# Patient Record
Sex: Male | Born: 1980 | Race: White | Hispanic: No | Marital: Married | Smoking: Never smoker
Health system: Southern US, Community
[De-identification: ages and names within clinical notes are randomized; demographics above are authoritative.]

## PROBLEM LIST (undated history)

## (undated) HISTORY — PX: SEPTOPLASTY: SUR1290

---

## 2010-03-27 ENCOUNTER — Encounter: Admission: RE | Admit: 2010-03-27 | Discharge: 2010-03-27 | Payer: Self-pay | Admitting: Otolaryngology

## 2015-02-08 ENCOUNTER — Encounter (HOSPITAL_COMMUNITY): Payer: Self-pay | Admitting: Emergency Medicine

## 2015-02-08 ENCOUNTER — Emergency Department (HOSPITAL_COMMUNITY)
Admission: EM | Admit: 2015-02-08 | Discharge: 2015-02-09 | Disposition: A | Discharge status: Home / Self Care | Payer: Federal, State, Local not specified - PPO | Attending: Emergency Medicine | Admitting: Emergency Medicine

## 2015-02-08 DIAGNOSIS — R509 Fever, unspecified: Secondary | ICD-10-CM | POA: Insufficient documentation

## 2015-02-08 LAB — COMPREHENSIVE METABOLIC PANEL
ALT: 15 U/L — AB (ref 17–63)
AST: 22 U/L (ref 15–41)
Albumin: 4 g/dL (ref 3.5–5.0)
Alkaline Phosphatase: 50 U/L (ref 38–126)
Anion gap: 9 (ref 5–15)
BUN: 13 mg/dL (ref 6–20)
CO2: 28 mmol/L (ref 22–32)
CREATININE: 1.08 mg/dL (ref 0.61–1.24)
Calcium: 9.3 mg/dL (ref 8.9–10.3)
Chloride: 101 mmol/L (ref 101–111)
GFR calc non Af Amer: >60 mL/min (ref >60)
Glucose, Bld: 133 mg/dL — ABNORMAL HIGH (ref 65–99)
Potassium: 3.7 mmol/L (ref 3.5–5.1)
Sodium: 138 mmol/L (ref 135–145)
Total Bilirubin: 1 mg/dL (ref 0.3–1.2)
Total Protein: 6.8 g/dL (ref 6.5–8.1)

## 2015-02-08 LAB — CBC WITH DIFFERENTIAL/PLATELET
BASOS ABS: 0 10*3/uL (ref 0.0–0.1)
Basophils Relative: 0 % (ref 0–1)
EOS ABS: 0.1 10*3/uL (ref 0.0–0.7)
EOS PCT: 1 % (ref 0–5)
HCT: 42.9 % (ref 39.0–52.0)
Hemoglobin: 14.8 g/dL (ref 13.0–17.0)
LYMPHS ABS: 0.5 10*3/uL — AB (ref 0.7–4.0)
Lymphocytes Relative: 9 % — ABNORMAL LOW (ref 12–46)
MCH: 30.5 pg (ref 26.0–34.0)
MCHC: 34.5 g/dL (ref 30.0–36.0)
MCV: 88.3 fL (ref 78.0–100.0)
MONO ABS: 0.1 10*3/uL (ref 0.1–1.0)
Monocytes Relative: 2 % — ABNORMAL LOW (ref 3–12)
Neutro Abs: 4.8 10*3/uL (ref 1.7–7.7)
Neutrophils Relative %: 88 % — ABNORMAL HIGH (ref 43–77)
Platelets: 122 10*3/uL — ABNORMAL LOW (ref 150–400)
RBC: 4.86 MIL/uL (ref 4.22–5.81)
RDW: 12.9 % (ref 11.5–15.5)
WBC: 5.4 10*3/uL (ref 4.0–10.5)

## 2015-02-08 MED ORDER — ACETAMINOPHEN 500 MG PO TABS
1000.0000 mg | ORAL_TABLET | Freq: Once | ORAL | Status: AC
  Administered 2015-02-09: 1000 mg via ORAL
  Filled 2015-02-08: qty 2

## 2015-02-08 MED ORDER — SODIUM CHLORIDE 0.9 % IV BOLUS (SEPSIS)
1000.0000 mL | INTRAVENOUS | Status: AC
  Administered 2015-02-09: 1000 mL via INTRAVENOUS

## 2015-02-08 MED ORDER — ACETAMINOPHEN 325 MG PO TABS
650.0000 mg | ORAL_TABLET | Freq: Once | ORAL | Status: DC
Start: 2015-02-08 — End: 2015-02-09

## 2015-02-08 MED ORDER — SODIUM CHLORIDE 0.9 % IV BOLUS (SEPSIS)
500.0000 mL | INTRAVENOUS | Status: AC
  Administered 2015-02-09: 500 mL via INTRAVENOUS

## 2015-02-08 NOTE — ED Notes (Signed)
[facility] contacted to call Code Sepsis 

## 2015-02-08 NOTE — ED Notes (Signed)
Pt. reports fever , chills, body aches and 'colds" onset this week . Pt. took Ibuprofen prior to arrival with slight relief. Denies cough or congestion .

## 2015-02-08 NOTE — ED Provider Notes (Signed)
CSN: [id number]     Arrival date & time 02/08/15  2221 History  This chart was scribed for Tomasita Crumble, MD by Freida Busman, ED Scribe. This patient was seen in room D34C/D34C and the patient's care was started 11:31 PM.    Chief Complaint  Patient presents with  . Fever  . Chills  . Generalized Body Aches     The history is provided by the patient. No language interpreter was used.     HPI Comments:  Bradley Reese is a 34 y.o. male who presents to the Emergency Department complaining of a fever that started today with a max temp of 102.9. Pt states he started shaking after dinner tonight; states he was coherent during this episode. He denies diaphoresis at the time of this episode but has since become diaphoretic. Pt reports similar episode ~2 weeks ago after spending a large amount of time outside; his friend told him he may have had a heat stroke. Pt has taken Motrin twice today; his last dose was  ~2130. He reports associated sore throat for 2 days, mild intermittent HA, and mild generalized body aches. He denies urinary symptoms, vomiting, diarrhea, neck pain, urinary symptoms, bowel/bladder incontincence.     History reviewed. No pertinent past medical history. History reviewed. No pertinent past surgical history. No family history on file. Social History  Substance Use Topics  . Smoking status: Never Smoker   . Smokeless tobacco: None  . Alcohol Use: Yes    Review of Systems  A complete 10 system review of systems was obtained and all systems are negative except as noted in the HPI and PMH.    Allergies  Review of patient's allergies indicates no known allergies.  Home Medications   Prior to Admission medications   Not on File   BP 141/79 mmHg  Pulse 124  Temp(Src) 100 F (37.8 C) (Oral)  Resp 18  Ht  (1.778 m)  Wt 167 lb (75.751 kg)  BMI 23.96 kg/m2  SpO2 95% Physical Exam  Constitutional: He is oriented to person, place, and time. Vital signs are  normal. He appears well-developed and well-nourished.  Non-toxic appearance. He does not appear ill. No distress.  Tactile fever  HENT:  Head: Normocephalic and atraumatic.  Nose: Nose normal.  Mouth/Throat: Oropharynx is clear and moist. No oropharyngeal exudate.  Eyes: Conjunctivae and EOM are normal. Pupils are equal, round, and reactive to light. No scleral icterus.  Neck: Normal range of motion. Neck supple. No tracheal deviation, no edema, no erythema and normal range of motion present. No thyroid mass and no thyromegaly present.  Cardiovascular: Normal rate, regular rhythm, S1 normal, S2 normal, normal heart sounds, intact distal pulses and normal pulses.  Exam reveals no gallop and no friction rub.   No murmur heard. Pulses:      Radial pulses are 2+ on the right side, and 2+ on the left side.       Dorsalis pedis pulses are 2+ on the right side, and 2+ on the left side.  Pulmonary/Chest: Effort normal and breath sounds normal. No respiratory distress. He has no wheezes. He has no rhonchi. He has no rales.  Abdominal: Soft. Normal appearance and bowel sounds are normal. He exhibits no distension, no ascites and no mass. There is no hepatosplenomegaly. There is no tenderness. There is no rebound, no guarding and no CVA tenderness.  Musculoskeletal: Normal range of motion. He exhibits no edema or tenderness.  Lymphadenopathy:  He has no cervical adenopathy.  Neurological: He is alert and oriented to person, place, and time. He has normal strength. No cranial nerve deficit or sensory deficit.  nml strength and sensation to all extremities nml cerebellar exam  Skin: Skin is warm and intact. No petechiae and no rash noted. He is diaphoretic. No erythema. No pallor.  Psychiatric: He has a normal mood and affect. His behavior is normal. Judgment normal.  Nursing note and vitals reviewed.   ED Course  Procedures   DIAGNOSTIC STUDIES:  Oxygen Saturation is 95% on RA, adequate by my  interpretation.    COORDINATION OF CARE:  11:35 PM Discussed treatment plan with pt at bedside and pt agreed to plan.  Labs Review Labs Reviewed  CBC WITH DIFFERENTIAL/PLATELET - Abnormal; Notable for the following:    Platelets 122 (*)    Neutrophils Relative % 88 (*)    Lymphocytes Relative 9 (*)    Lymphs Abs 0.5 (*)    Monocytes Relative 2 (*)    All other components within normal limits  COMPREHENSIVE METABOLIC PANEL - Abnormal; Notable for the following:    Glucose, Bld 133 (*)    ALT 15 (*)    All other components within normal limits  URINALYSIS, ROUTINE W REFLEX MICROSCOPIC (NOT AT [facility]) - Abnormal; Notable for the following:    Ketones, ur 15 (*)    All other components within normal limits  CULTURE, BLOOD (ROUTINE X 2)  CULTURE, BLOOD (ROUTINE X 2)  URINE CULTURE  I-STAT CG4 LACTIC ACID, ED  Rosezena Sensor, ED    Imaging Review Dg Chest 2 View  02/09/2015   CLINICAL DATA:  Fever of unknown origin. Right nurse. Seven control shaking for couple of hr. Second time in 3 weeks.  EXAM: CHEST  2 VIEW  COMPARISON:  None.  FINDINGS: The heart size and mediastinal contours are within normal limits. Both lungs are clear. The visualized skeletal structures are unremarkable.  IMPRESSION: No active cardiopulmonary disease.   Electronically Signed   By: Burman Nieves M.D.   On: 02/09/2015 01:46   I have personally reviewed and evaluated these images and lab results as part of my medical decision-making.   EKG Interpretation   Date/Time:  Sunday February 09 2015 00:07:10 EDT Ventricular Rate:  79 PR Interval:  145 QRS Duration: 97 QT Interval:  397 QTC Calculation: 455 R Axis:   72 Text Interpretation:  Sinus rhythm Probable left atrial enlargement  Baseline wander in lead(s) V3 V5 V6 Confirmed by Erroll Luna  [phone]) on 02/09/2015 12:15:15 AM      MDM   Final diagnoses:  None  Patient presents to the ED for fevers, chills, and weakness.  He also has  concern for a heart condition.  He took motrin, but still has a fever here to 38.5.  He was given tylenol in the ED.  Will perform sepsis work up as well.    CXR and UA negative for infectious cause.  Patients fever and tachycardia has resolved and he feels back to baseline.  He was advised to see a PCP within 3 days for close follow up.  His VS remain within his normal limits and he is safe for discharge.  I personally performed the services described in this documentation, which was scribed in my presence. The recorded information has been reviewed and is accurate.   Tomasita Crumble, MD 02/09/15 0200

## 2015-02-08 NOTE — ED Notes (Signed)
MD at bedside. 

## 2015-02-09 ENCOUNTER — Emergency Department (HOSPITAL_COMMUNITY): Payer: Federal, State, Local not specified - PPO

## 2015-02-09 LAB — I-STAT TROPONIN, ED: Troponin i, poc: 0 ng/mL (ref 0.00–0.08)

## 2015-02-09 LAB — URINALYSIS, ROUTINE W REFLEX MICROSCOPIC
Bilirubin Urine: NEGATIVE
GLUCOSE, UA: NEGATIVE mg/dL
Hgb urine dipstick: NEGATIVE
Ketones, ur: 15 mg/dL — AB
LEUKOCYTES UA: NEGATIVE
NITRITE: NEGATIVE
PROTEIN: NEGATIVE mg/dL
Specific Gravity, Urine: 1.011 (ref 1.005–1.030)
Urobilinogen, UA: 0.2 mg/dL (ref 0.0–1.0)
pH: 6.5 (ref 5.0–8.0)

## 2015-02-09 LAB — I-STAT CG4 LACTIC ACID, ED: Lactic Acid, Venous: 0.59 mmol/L (ref 0.5–2.0)

## 2015-02-09 NOTE — Discharge Instructions (Signed)
Fever, Adult Bradley Reese, continue to use ibuprofen or tylenol for your fever.  See a primary care doctor within 3 days for close follow up.  If fever persists or if you develop other symptoms, return to the ED immediately. Thank you. A fever is a temperature of 100.4 F (38 C) or above.  HOME CARE  Take fever medicine as told by your doctor. Do not  take aspirin for fever if you are younger than 34 years of age.  If you are given antibiotic medicine, take it as told. Finish the medicine even if you start to feel better.  Rest.  Drink enough fluids to keep your pee (urine) clear or pale yellow. Do not drink alcohol.  Take a bath or shower with room temperature water. Do not use ice water or alcohol sponge baths.  Wear lightweight, loose clothes. GET HELP RIGHT AWAY IF:   You are short of breath or have trouble breathing.  You are very weak.  You are dizzy or you pass out (faint).  You are very thirsty or are making little or no urine.  You have new pain.  You throw up (vomit) or have watery poop (diarrhea).  You keep throwing up or having watery poop for more than 1 to 2 days.  You have a stiff neck or light bothers your eyes.  You have a skin rash.  You have a fever or problems (symptoms) that last for more than 2 to 3 days.  You have a fever and your problems quickly get worse.  You keep throwing up the fluids you drink.  You do not feel better after 3 days.  You have new problems. MAKE SURE YOU:   Understand these instructions.  Will watch your condition.  Will get help right away if you are not doing well or get worse. Document Released: 03/09/2008 Document Revised: 08/23/2011 Document Reviewed: 04/01/2011 [facility] Patient Information 2015 [employer], [place]. This information is not intended to replace advice given to you by your health care provider. Make sure you discuss any questions you have with your health care provider.

## 2015-02-09 NOTE — ED Notes (Signed)
Pt obtaining urine sample at this time.

## 2015-02-10 LAB — URINE CULTURE: Culture: NO GROWTH

## 2015-02-14 LAB — CULTURE, BLOOD (ROUTINE X 2): Culture: NO GROWTH

## 2015-06-10 ENCOUNTER — Telehealth: Payer: Self-pay | Admitting: Internal Medicine

## 2015-06-10 NOTE — Telephone Encounter (Signed)
Received records from [facility] for appointment on 07/14/14 with Dr Rennis GoldenHilty.  Records given to [employer] (medical records) for Dr [facility] on 07/14/14. lp

## 2015-07-15 ENCOUNTER — Ambulatory Visit (INDEPENDENT_AMBULATORY_CARE_PROVIDER_SITE_OTHER): Payer: Federal, State, Local not specified - PPO | Admitting: Internal Medicine

## 2015-07-15 ENCOUNTER — Encounter: Payer: Self-pay | Admitting: Internal Medicine

## 2015-07-15 VITALS — BP 110/62 | HR 59 | Ht 70.0 in | Wt 163.4 lb

## 2015-07-15 DIAGNOSIS — R0602 Shortness of breath: Secondary | ICD-10-CM | POA: Diagnosis not present

## 2015-07-15 DIAGNOSIS — R0789 Other chest pain: Secondary | ICD-10-CM | POA: Diagnosis not present

## 2015-07-15 DIAGNOSIS — R5383 Other fatigue: Secondary | ICD-10-CM | POA: Diagnosis not present

## 2015-07-15 DIAGNOSIS — Z13220 Encounter for screening for lipoid disorders: Secondary | ICD-10-CM | POA: Diagnosis not present

## 2015-07-15 NOTE — Patient Instructions (Signed)
Dr Rennis Golden has requested that you have an echocardiogram. Echocardiography is a painless test that uses sound waves to create images of your heart. It provides your doctor with information about the size and shape of your heart and how well your heart's chambers and valves are working. This procedure takes approximately one hour. There are no restrictions for this procedure. This will be done at our [facility] location - [address].  Your physician has recommended that you have a cardiopulmonary stress test (CPX). CPX testing is a non-invasive measurement of heart and lung function. It replaces a traditional treadmill stress test. This type of test provides a tremendous amount of information that relates not only to your present condition but also for future outcomes. This test combines measurements of you ventilation, respiratory gas exchange in the lungs, electrocardiogram (EKG), blood pressure and physical response before, during, and following an exercise protocol.  Your physician recommends that you return for lab work at your earliest convenience - FASTING.  Dr Rennis Golden recommends that you schedule a follow-up appointment after your tests have been completed.

## 2015-07-15 NOTE — Progress Notes (Signed)
OFFICE NOTE  Chief Complaint:  Shortness of breath, fatigue, chest discomfort  Primary Care Physician: Sissy Hoff, MD  HPI:  Bradley Reese is a pleasant 35 year old male who is referred to me by Bradley Reese for evaluation of shortness of breath and chest discomfort. His mother is actually a patient of mine as well. His family history is significant more on his father's side who had had 2 vessel bypass surgery and a number of other cardiac issues. Mr. Bradley Reese reports that he has been under a lot of stress recently as his daughter was initially thought to have a VSD but later found to have PAPVR. She underwent surgery at [facility] in his possibly in need of another surgery. Recently he's noted that during weight lifting he has some discomfort in his chest. He gets short of breath and oftentimes causes him to discontinue exercise. He always feels a little bit worse after exercising rather than better. In general though he feels like he is pretty healthy. He plays tennis and is fairly active and does not have any problems doing that type of exercise. He had an unusual episode this past summer where he went to the emergency department and had fever up to 105 with rigors, but there were no clear causes for this episode. He says that the symptoms actually been going on for some time and not necessarily worsening or no crescendo pattern. He denies any typical chest pain. He just feels a strange sensation. Apparently had a cholesterol profile 2014 which showed an LDL 100.  PMHx:  No past medical history on file.  Past Surgical History  Procedure Laterality Date  . Septoplasty      FAMHx:  Family History  Problem Relation Age of Onset  . Transient ischemic attack Mother   . Hypertension Mother   . COPD Mother   . Cancer Father     stage 4 cancer  . Heart disease Father     double bypass  . Cancer Maternal Grandmother   . Heart disease Paternal Grandmother   . Congenital heart  disease Child     vsd, afo, papvr    SOCHx:   reports that he has never smoked. He does not have any smokeless tobacco history on file. He reports that he drinks alcohol. He reports that he does not use illicit drugs.  ALLERGIES:  No Known Allergies  ROS: Pertinent items noted in HPI and remainder of comprehensive ROS otherwise negative.  HOME MEDS: Current Outpatient Prescriptions  Medication Sig Dispense Refill  . Nutritional Supplements (JUICE PLUS FIBRE PO) Take by mouth daily.    . Triamcinolone Acetonide (NASACORT AQ NA) Place 1 spray into the nose daily.     No current facility-administered medications for this visit.    LABS/IMAGING: No results found for this or any previous visit (from the past 48 hour(s)). No results found.  WEIGHTS: Wt Readings from Last 3 Encounters:  07/15/15 163 lb 6.4 oz (74.118 kg)  02/09/15 167 lb (75.751 kg)    VITALS: BP 110/62 mmHg  Pulse 59  Ht  (1.778 m)  Wt 163 lb 6.4 oz (74.118 kg)  BMI 23.45 kg/m2  EXAM: General appearance: alert and no distress Neck: no carotid bruit, no JVD and thyroid not enlarged, symmetric, no tenderness/mass/nodules Lungs: clear to auscultation bilaterally Heart: regular rate and rhythm, S1, S2 normal, no murmur, click, rub or gallop Abdomen: soft, non-tender; bowel sounds normal; no masses,  no organomegaly Extremities: extremities normal,  atraumatic, no cyanosis or edema Pulses: 2+ and symmetric Skin: Skin color, texture, turgor normal. No rashes or lesions Neurologic: Grossly normal Psych: Pleasant  EKG: Sinus bradycardia 59  ASSESSMENT: 1. Chest discomfort and shortness of breath with weight lifting  PLAN: 1.   [name] has few personal risk factors for coronary disease but does have a strong family history of heart disease. He is reported fairly stable discomfort after lifting which include some shortness of breath and discomfort in the chest. He says that sometimes it causes him to  discontinue exercise. He feels chronically a little fatigue and wears out fairly easily. His EKG is reassuring. I would like an echocardiogram to rule out any possible cardiomyopathy. In addition, I think would be beneficial to get a cardio metabolic exercise test. This or be helpful to determine his VO2 with exercise. If he has some type of metabolic deficiency that is causing him to be fatigued or short of breath easily that would be more identified on this type of testing versus traditional treadmill stress testing. I'll also check a fasting lipid profile. Plan to see him back to review those results in a few weeks.  Thanks again for the kind referral.  Chrystie Nose, MD, [facility] Attending Cardiologist [facility]  Chrystie Nose 07/15/2015, 9:50 AM

## 2015-07-22 ENCOUNTER — Ambulatory Visit (HOSPITAL_COMMUNITY): Payer: Federal, State, Local not specified - PPO | Attending: Family Medicine

## 2015-07-22 DIAGNOSIS — R0602 Shortness of breath: Secondary | ICD-10-CM | POA: Diagnosis not present

## 2015-07-23 ENCOUNTER — Other Ambulatory Visit (HOSPITAL_COMMUNITY): Payer: Self-pay | Admitting: *Deleted

## 2015-07-23 DIAGNOSIS — R0602 Shortness of breath: Secondary | ICD-10-CM | POA: Diagnosis not present

## 2015-07-24 LAB — LIPID PANEL
CHOLESTEROL: 171 mg/dL (ref 125–200)
HDL: 46 mg/dL (ref >=40)
LDL CALC: 109 mg/dL (ref <130)
TRIGLYCERIDES: 82 mg/dL (ref <150)
Total CHOL/HDL Ratio: 3.7 Ratio (ref <=5.0)
VLDL: 16 mg/dL (ref <30)

## 2015-08-05 ENCOUNTER — Other Ambulatory Visit: Payer: Self-pay

## 2015-08-05 ENCOUNTER — Ambulatory Visit (HOSPITAL_COMMUNITY): Payer: Federal, State, Local not specified - PPO | Attending: Cardiology

## 2015-08-05 DIAGNOSIS — R0602 Shortness of breath: Secondary | ICD-10-CM | POA: Insufficient documentation

## 2015-08-05 DIAGNOSIS — I517 Cardiomegaly: Secondary | ICD-10-CM | POA: Insufficient documentation

## 2015-08-05 DIAGNOSIS — I340 Nonrheumatic mitral (valve) insufficiency: Secondary | ICD-10-CM | POA: Diagnosis not present

## 2015-08-05 DIAGNOSIS — I071 Rheumatic tricuspid insufficiency: Secondary | ICD-10-CM | POA: Diagnosis not present

## 2015-08-05 DIAGNOSIS — Z8249 Family history of ischemic heart disease and other diseases of the circulatory system: Secondary | ICD-10-CM | POA: Insufficient documentation

## 2015-08-05 DIAGNOSIS — R0600 Dyspnea, unspecified: Secondary | ICD-10-CM | POA: Diagnosis present

## 2015-08-13 ENCOUNTER — Encounter: Payer: Self-pay | Admitting: Internal Medicine

## 2015-08-13 ENCOUNTER — Ambulatory Visit (INDEPENDENT_AMBULATORY_CARE_PROVIDER_SITE_OTHER): Payer: Federal, State, Local not specified - PPO | Admitting: Internal Medicine

## 2015-08-13 ENCOUNTER — Other Ambulatory Visit: Payer: Self-pay | Admitting: *Deleted

## 2015-08-13 VITALS — BP 122/80 | HR 60 | Ht 70.0 in | Wt 159.0 lb

## 2015-08-13 DIAGNOSIS — D689 Coagulation defect, unspecified: Secondary | ICD-10-CM

## 2015-08-13 DIAGNOSIS — I429 Cardiomyopathy, unspecified: Secondary | ICD-10-CM

## 2015-08-13 DIAGNOSIS — R5383 Other fatigue: Secondary | ICD-10-CM

## 2015-08-13 DIAGNOSIS — Z01818 Encounter for other preprocedural examination: Secondary | ICD-10-CM

## 2015-08-13 DIAGNOSIS — R9439 Abnormal result of other cardiovascular function study: Secondary | ICD-10-CM

## 2015-08-13 DIAGNOSIS — Z79899 Other long term (current) drug therapy: Secondary | ICD-10-CM | POA: Diagnosis not present

## 2015-08-13 DIAGNOSIS — I420 Dilated cardiomyopathy: Secondary | ICD-10-CM

## 2015-08-13 NOTE — Progress Notes (Signed)
OFFICE NOTE  Chief Complaint:  Follow-up studies  Primary Care Physician: Sissy Hoff, MD  HPI:  Bradley Reese is a pleasant 35 year old male who is referred to me by [name] for evaluation of shortness of breath and chest discomfort. His mother is actually a patient of mine as well. His family history is significant more on his father's side who had had 2 vessel bypass surgery and a number of other cardiac issues. Bradley Reese reports that he has been under a lot of stress recently as his daughter was initially thought to have a VSD but later found to have PAPVR. She underwent surgery at [facility] in his possibly in need of another surgery. Recently he's noted that during weight lifting he has some discomfort in his chest. He gets short of breath and oftentimes causes him to discontinue exercise. He always feels a little bit worse after exercising rather than better. In general though he feels like he is pretty healthy. He plays tennis and is fairly active and does not have any problems doing that type of exercise. He had an unusual episode this past summer where he went to the emergency department and had fever up to 105 with rigors, but there were no clear causes for this episode. He says that the symptoms actually been going on for some time and not necessarily worsening or no crescendo pattern. He denies any typical chest pain. He just feels a strange sensation. Apparently had a cholesterol profile 2014 which showed an LDL 100.  Bradley Reese returns today for follow-up of his studies. He underwent cutting pulmonary exercise testing which showed good exercise tolerance however he had a significant drop in oxygen saturation near and exercise. His O2 saturation dropped to 73% without a clear etiology. There was concern about possible shunting. Subsequently he had an echocardiogram which was already ordered. As I did not know about shunting, the study was not done with bubble contrast.  This demonstrated, unfortunately that he has moderate global hypokinesis with an EF of 45-50% and mild dilation of left ventricle. He has reported chest pain with exertion, but more like shortness of breath and heaviness. He has not done much exercise since his stress testing.  PMHx:  No past medical history on file.  Past Surgical History  Procedure Laterality Date  . Septoplasty      FAMHx:  Family History  Problem Relation Age of Onset  . Transient ischemic attack Mother   . Hypertension Mother   . COPD Mother   . Cancer Father     stage 4 cancer  . Heart disease Father     double bypass  . Cancer Maternal Grandmother   . Heart disease Paternal Grandmother   . Congenital heart disease Child     vsd, afo, papvr    SOCHx:   reports that he has never smoked. He has never used smokeless tobacco. He reports that he drinks alcohol. He reports that he does not use illicit drugs.  ALLERGIES:  No Known Allergies  ROS: Pertinent items noted in HPI and remainder of comprehensive ROS otherwise negative.  HOME MEDS: Current Outpatient Prescriptions  Medication Sig Dispense Refill  . Nutritional Supplements (JUICE PLUS FIBRE PO) Take 1 tablet by mouth daily.     . Triamcinolone Acetonide (NASACORT AQ NA) Place 1 spray into the nose daily as needed.      No current facility-administered medications for this visit.    LABS/IMAGING: No results found for this  or any previous visit (from the past 48 hour(s)). No results found.  WEIGHTS: Wt Readings from Last 3 Encounters:  08/13/15 159 lb (72.122 kg)  07/15/15 163 lb 6.4 oz (74.118 kg)  02/09/15 167 lb (75.751 kg)    VITALS: BP 122/80 mmHg  Pulse 60  Ht  (1.778 m)  Wt 159 lb (72.122 kg)  BMI 22.81 kg/m2  SpO2 97%  EXAM: deferred  EKG: deferred  ASSESSMENT: 1. Chest discomfort and shortness of breath with weight lifting 2. 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 Exercise-induced hypoxemia, question shunting  PLAN: 1.   Bradley Reese has exercise induced hypoxemia with a newly diagnosed cardiomyopathy of EF 45-50%. Given his daughter's congenital abnormalities, I'm suspicious that he may have some congenital heart disease. Possibilities include PFO or atrial septal defect or abnormal pulmonary venous return, as is the case with his daughter who had PAPVR. I would recommend Obtaining a TEE with bubble study to look for atrial level shunting or extrapulmonary shunting. If there is evidence of extrapulmonary shunting, then I recommend a cardiac CT angiogram, to rule out obstructive coronary disease which is unlikely, but also evaluate extracardiac structures, pulmonary venous return and any other intrapulmonary abnormalities that could be responsible for shunting and exercise-induced hypoxemia. Based on his echo findings, we will likely start him on low-dose ARB as blood pressure will tolerate. Heart rate is at 60 and beta blocker will not likely be tolerated.  Chrystie Nose, MD, [facility] Attending Cardiologist [facility]  Chrystie Nose 08/13/2015, 9:44 AM

## 2015-08-13 NOTE — Patient Instructions (Addendum)
Dr. Rennis Golden has ordered a TRANSESOPHAGEAL ECHOCARDIOGRAM (TEE).  -- please schedule with Dr. Rennis Golden (possibly 3/14) -- this is done at [facility]  You will need to have the following test(s) done no more than 1 week prior to the TEE 1. Blood work - the blood work can be done no more than 7 days prior to the procedure.  It can be done at any [facility] lab. There is a lab downstairs on the first floor of this building in [address] and one at [address].  2. Chest X-ray - this can be done at [facility] in the [facility] @ [address]  Transesophageal Echocardiogram Transesophageal echocardiography (TEE) is a picture test of your heart using sound waves. The pictures taken can give very detailed pictures of your heart. This can help your doctor see if there are problems with your heart. TEE can check:  If your heart has blood clots in it.  How well your heart valves are working.  If you have an infection on the inside of your heart.  Some of the major arteries of your heart.  If your heart valve is working after a Psychologist, forensic.  Your heart before a procedure that uses a shock to your heart to get the rhythm back to normal. BEFORE THE PROCEDURE  Do not eat or drink for 6 hours before the procedure or as told by your doctor.  Make plans to have someone drive you home after the procedure. Do not drive yourself home.  An IV tube will be put in your arm. PROCEDURE  You will be given a medicine to help you relax (sedative). It will be given through the IV tube.  A numbing medicine will be sprayed or gargled in the back of your throat to help numb it.  The tip of the probe is placed into the back of your mouth. You will be asked to swallow. This helps to pass the probe into your esophagus.  Once the tip of the probe is in the right place, your doctor can take pictures of your heart.  You may feel pressure at the back of your  throat. AFTER THE PROCEDURE  You will be taken to a recovery area so the sedative can wear off.  Your throat may be sore and scratchy. This will go away slowly over time.  You will go home when you are fully awake and able to swallow liquids.  You should have someone stay with you for the next 24 hours.  Do not drive or operate machinery for the next 24 hours.   This information is not intended to replace advice given to you by your health care provider. Make sure you discuss any questions you have with your health care provider.   Document Released: 03/28/2009 Document Revised: 06/05/2013 Document Reviewed: 11/30/2012 Elsevier Interactive Patient Education [employer]

## 2015-08-15 ENCOUNTER — Telehealth: Payer: Self-pay | Admitting: *Deleted

## 2015-08-15 ENCOUNTER — Encounter: Payer: Self-pay | Admitting: Internal Medicine

## 2015-08-15 NOTE — Telephone Encounter (Signed)
Spoke with patient regarding time change of TEE scheduled on Tuesday 08/26/15---procedure time 2:30 pm arrive at Short Stay 1:30 pm.  Patient voiced his understanding and ask that I mail instructions to him.  I informed the patient I will mail today.

## 2015-08-20 LAB — BASIC METABOLIC PANEL
BUN: 15 mg/dL (ref 7–25)
CO2: 28 mmol/L (ref 20–31)
Calcium: 9.7 mg/dL (ref 8.6–10.3)
Chloride: 103 mmol/L (ref 98–110)
Creat: 1.07 mg/dL (ref 0.60–1.35)
Glucose, Bld: 83 mg/dL (ref 65–99)
Potassium: 4.6 mmol/L (ref 3.5–5.3)
Sodium: 142 mmol/L (ref 135–146)

## 2015-08-20 LAB — PROTIME-INR
INR: 1.05 (ref <1.50)
Prothrombin Time: 13.8 seconds (ref 11.6–15.2)

## 2015-08-20 LAB — CBC
HCT: 46.8 % (ref 39.0–52.0)
Hemoglobin: 15.9 g/dL (ref 13.0–17.0)
MCH: 29.3 pg (ref 26.0–34.0)
MCHC: 34 g/dL (ref 30.0–36.0)
MCV: 86.2 fL (ref 78.0–100.0)
MPV: 10.1 fL (ref 8.6–12.4)
Platelets: 202 10*3/uL (ref 150–400)
RBC: 5.43 MIL/uL (ref 4.22–5.81)
RDW: 12.8 % (ref 11.5–15.5)
WBC: 3.9 10*3/uL — ABNORMAL LOW (ref 4.0–10.5)

## 2015-08-20 LAB — APTT: aPTT: 40 seconds — ABNORMAL HIGH (ref 24–37)

## 2015-08-20 LAB — TSH: TSH: 1.8 m[IU]/L (ref 0.40–4.50)

## 2015-08-21 ENCOUNTER — Ambulatory Visit
Admission: RE | Admit: 2015-08-21 | Discharge: 2015-08-21 | Disposition: A | Discharge status: Home / Self Care | Payer: Federal, State, Local not specified - PPO | Source: Ambulatory Visit | Attending: Internal Medicine | Admitting: Internal Medicine

## 2015-08-21 DIAGNOSIS — Z01818 Encounter for other preprocedural examination: Secondary | ICD-10-CM

## 2015-08-26 ENCOUNTER — Ambulatory Visit (HOSPITAL_COMMUNITY)
Admission: RE | Admit: 2015-08-26 | Discharge: 2015-08-26 | Disposition: A | Discharge status: Home / Self Care | Payer: Federal, State, Local not specified - PPO | Source: Ambulatory Visit | Attending: Internal Medicine | Admitting: Internal Medicine

## 2015-08-26 ENCOUNTER — Encounter (HOSPITAL_COMMUNITY)
Admission: RE | Disposition: A | Discharge status: Home / Self Care | Payer: Self-pay | Source: Ambulatory Visit | Attending: Internal Medicine

## 2015-08-26 ENCOUNTER — Encounter (HOSPITAL_COMMUNITY): Payer: Self-pay

## 2015-08-26 DIAGNOSIS — Z8249 Family history of ischemic heart disease and other diseases of the circulatory system: Secondary | ICD-10-CM | POA: Diagnosis not present

## 2015-08-26 DIAGNOSIS — R0602 Shortness of breath: Secondary | ICD-10-CM | POA: Diagnosis not present

## 2015-08-26 DIAGNOSIS — I429 Cardiomyopathy, unspecified: Secondary | ICD-10-CM | POA: Diagnosis not present

## 2015-08-26 HISTORY — PX: TEE WITHOUT CARDIOVERSION: SHX5443

## 2015-08-26 SURGERY — ECHOCARDIOGRAM, TRANSESOPHAGEAL
Anesthesia: Moderate Sedation

## 2015-08-26 MED ORDER — DIPHENHYDRAMINE HCL 50 MG/ML IJ SOLN
INTRAMUSCULAR | Status: AC
  Filled 2015-08-26: qty 1

## 2015-08-26 MED ORDER — DIPHENHYDRAMINE HCL 50 MG/ML IJ SOLN
INTRAMUSCULAR | Status: DC | PRN
  Administered 2015-08-26: 25 mg via INTRAVENOUS

## 2015-08-26 MED ORDER — LIDOCAINE VISCOUS 2 % MT SOLN
OROMUCOSAL | Status: AC
  Filled 2015-08-26: qty 15

## 2015-08-26 MED ORDER — SODIUM CHLORIDE 0.9 % IV SOLN: INTRAVENOUS | Status: DC

## 2015-08-26 MED ORDER — LIDOCAINE VISCOUS 2 % MT SOLN
OROMUCOSAL | Status: DC | PRN
  Administered 2015-08-26: 10 mL via OROMUCOSAL

## 2015-08-26 MED ORDER — BUTAMBEN-TETRACAINE-BENZOCAINE 2-2-14 % EX AERO
INHALATION_SPRAY | CUTANEOUS | Status: DC | PRN
  Administered 2015-08-26: 2 via TOPICAL

## 2015-08-26 MED ORDER — FENTANYL CITRATE (PF) 100 MCG/2ML IJ SOLN
INTRAMUSCULAR | Status: AC
  Filled 2015-08-26: qty 2

## 2015-08-26 MED ORDER — MIDAZOLAM HCL 10 MG/2ML IJ SOLN
INTRAMUSCULAR | Status: DC | PRN
  Administered 2015-08-26: 2 mg via INTRAVENOUS
  Administered 2015-08-26: 1 mg via INTRAVENOUS
  Administered 2015-08-26: 2 mg via INTRAVENOUS

## 2015-08-26 MED ORDER — FENTANYL CITRATE (PF) 100 MCG/2ML IJ SOLN
INTRAMUSCULAR | Status: DC | PRN
  Administered 2015-08-26 (×2): 50 ug via INTRAVENOUS

## 2015-08-26 MED ORDER — MIDAZOLAM HCL 5 MG/ML IJ SOLN
INTRAMUSCULAR | Status: AC
  Filled 2015-08-26: qty 2

## 2015-08-26 NOTE — Discharge Instructions (Signed)

## 2015-08-26 NOTE — H&P (Signed)
     INTERVAL PROCEDURE H&P  History and Physical Interval Note:  08/26/2015 2:35 PM  Bradley Reese has presented today for their planned procedure. The various methods of treatment have been discussed with the patient and family. After consideration of risks, benefits and other options for treatment, the patient has consented to the procedure.  The patients' outpatient history has been reviewed, patient examined, and no change in status from most recent office note within the past 30 days. I have reviewed the patients' chart and labs and will proceed as planned. Questions were answered to the patient's satisfaction.   Bradley NoseKenneth C. Hilty, MD, [facility] Attending Cardiologist [facility]  Bradley NoseKenneth C Reese 08/26/2015, 2:35 PM

## 2015-08-26 NOTE — CV Procedure (Signed)
TRANSESOPHAGEAL ECHOCARDIOGRAM (TEE) NOTE  INDICATIONS: cardiomyopathy, exercise induced hypoxia - ?left to right shunt  PROCEDURE:   Informed consent was obtained prior to the procedure. The risks, benefits and alternatives for the procedure were discussed and the patient comprehended these risks.  Risks include, but are not limited to, cough, sore throat, vomiting, nausea, somnolence, esophageal and stomach trauma or perforation, bleeding, low blood pressure, aspiration, pneumonia, infection, trauma to the teeth and death.    After a procedural time-out, the patient was given 6 mg versed and 100 mcg fentanyl along with 25 mg IV benadryl for moderate sedation.  The oropharynx was anesthetized 10 cc of topical 1% viscous lidocaine and 2 cetacaine sprays.  The transesophageal probe was inserted in the esophagus and stomach without difficulty and multiple views were obtained.  The patient was kept under observation until the patient left the procedure room.  The patient left the procedure room in stable condition.   Agitated microbubble saline contrast was administered.  COMPLICATIONS:    There were no immediate complications.  Findings:  1. LEFT VENTRICLE: The left ventricular wall thickness is normal.  The left ventricular cavity is normal in size. Wall motion is normal.  LVEF is 55-60%.  2. RIGHT VENTRICLE:  The right ventricle is normal in structure and function without any thrombus or masses.    3. LEFT ATRIUM:  The left atrium is normal in size without any thrombus or masses.  There is not spontaneous echo contrast ("smoke") in the left atrium consistent with a low flow state.  4. LEFT ATRIAL APPENDAGE:  The left atrial appendage is free of any thrombus or masses. The appendage has single lobes. Pulse doppler indicates moderate flow in the appendage.  5. ATRIAL SEPTUM:  The atrial septum appears intact and is free of thrombus and/or masses.  There is no evidence for interatrial  shunting by color doppler and saline microbubble.  6. RIGHT ATRIUM:  The right atrium is normal in size and function without any thrombus or masses.  7. MITRAL VALVE:  The mitral valve is normal in structure and function with trivial regurgitation.  There were no vegetations or stenosis.  8. AORTIC VALVE:  The aortic valve is trileaflet, normal in structure and function with trivial regurgitation. Lambl's excrescences (normal variant) were noted.  There were no vegetations or stenosis.  9. TRICUSPID VALVE:  The tricuspid valve is normal in structure and function with trivial regurgitation.  There were no vegetations or stenosis  10.  PULMONIC VALVE:  The pulmonic valve is normal in structure and function with trivial to mild regurgitation.  There were no vegetations or stenosis.   11. AORTIC ARCH, ASCENDING AND DESCENDING AORTA:  There was no Myrtis Ser(Katz et. Al, 1992) atherosclerosis of the ascending aorta, aortic arch, or proximal descending aorta.  12. PULMONARY VEINS: Anomalous pulmonary venous return was not noted.  13. PERICARDIUM: The pericardium appeared normal and non-thickened.  There is no pericardial effusion.  IMPRESSION:   1. No LAA thrombus. 2. Negative for PFO, ASD or left to right shunt at the atrial level. 3. No late saline microbubbles suggestive of intrapulmonary or intrahepatic shunt. 4. LVEF appears normal at 55-60%. 5. No evidence for anomalous pulmonary venous return.  RECOMMENDATIONS:    1. Will likely need further work-up to understand cardiomyopathy. I will personally review his original echo and re-measure LV function. It may be reasonable given his family history of CAD and exercise induced symptoms, to consider CAD evaluation. CT angiography  may be the best way to do this and also fully evaluate for any congenital structural abnormalities that may not have been seen on TEE.  Time Spent Directly with the Patient:  45 minutes   Chrystie Nose, MD,  [facility] Attending Cardiologist [facility]  08/26/2015, 4:26 PM

## 2015-08-27 ENCOUNTER — Encounter (HOSPITAL_COMMUNITY): Payer: Self-pay | Admitting: Internal Medicine

## 2015-08-27 ENCOUNTER — Other Ambulatory Visit: Payer: Self-pay | Admitting: *Deleted

## 2015-08-27 DIAGNOSIS — I519 Heart disease, unspecified: Secondary | ICD-10-CM

## 2015-08-27 DIAGNOSIS — R079 Chest pain, unspecified: Secondary | ICD-10-CM

## 2015-08-27 DIAGNOSIS — R9439 Abnormal result of other cardiovascular function study: Secondary | ICD-10-CM

## 2015-08-28 ENCOUNTER — Encounter: Payer: Self-pay | Admitting: Internal Medicine

## 2015-09-01 ENCOUNTER — Encounter: Payer: Self-pay | Admitting: Internal Medicine

## 2015-09-12 ENCOUNTER — Ambulatory Visit (HOSPITAL_COMMUNITY)
Admission: RE | Admit: 2015-09-12 | Discharge: 2015-09-12 | Disposition: A | Discharge status: Home / Self Care | Payer: Federal, State, Local not specified - PPO | Source: Ambulatory Visit | Attending: Internal Medicine | Admitting: Internal Medicine

## 2015-09-12 ENCOUNTER — Encounter (HOSPITAL_COMMUNITY): Payer: Self-pay

## 2015-09-12 DIAGNOSIS — R9439 Abnormal result of other cardiovascular function study: Secondary | ICD-10-CM | POA: Diagnosis not present

## 2015-09-12 DIAGNOSIS — I519 Heart disease, unspecified: Secondary | ICD-10-CM | POA: Diagnosis not present

## 2015-09-12 DIAGNOSIS — R079 Chest pain, unspecified: Secondary | ICD-10-CM | POA: Insufficient documentation

## 2015-09-12 IMAGING — CT CT HEART MORP W/ CTA COR W/ SCORE W/ CA W/CM &/OR W/O CM
1 of 11 series · 1 of 20 positions shown, 2 images · non-contrast
Comparison: None.

CLINICAL DATA: 34-year-old male with cardiomyopathy of unknown
etiology and family history of PAPVR.

EXAM:
Cardiac/Coronary  CT
TECHNIQUE: The patient was scanned on a Philips 256 scanner.

[Series 300: locator · axial · 0.35mm/px · z∈[-176,-176]mm · 1 of 1 slices shown, 2 images]
[im 1/1  vessel]
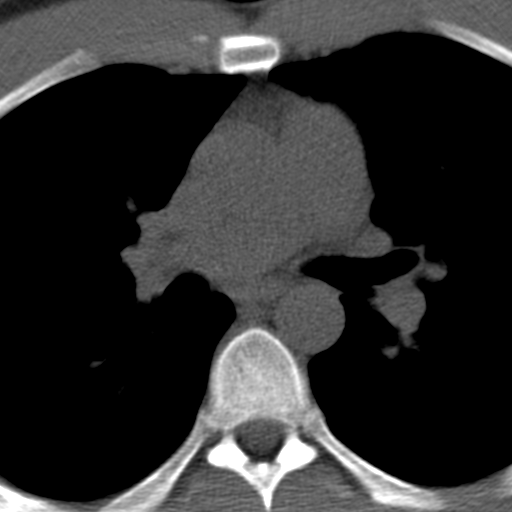
[im 1/1  lung]
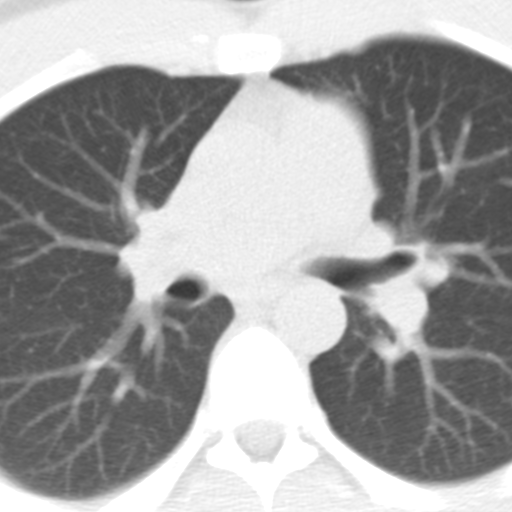

[1 of 20 positions shown; findings below may reference images not displayed]

FINDINGS: A 120 kV prospective scan was triggered in the descending thoracic
aorta at 111 HU's. Axial non-contrast 3 mm slices were carried out
through the heart. The data set was analyzed on a dedicated work
station and scored using the Agatson method. Gantry rotation speed
was 270 msecs and collimation was .9 mm. 5 mg of iv Metoprolol and
and 0.4 mg of sl NTG was given. The 3D data set was reconstructed in
5% intervals of the 67-82 % of the R-R cycle. Diastolic phases were
analyzed on a dedicated work station using MPR, MIP and VRT modes.
The patient received 80 cc of contrast.

Aorta:  Normal size.  No calcifications.  No dissection.

Aortic Valve: Trileaflet with normal thickness and no
calcifications.

Coronary Arteries:  Normal coronary origin.  Right dominance.

Left main is a large artery and has no plaque.

LAD is a large caliber artery with no plaque.

D1 is a large artery with no plaque.

D2 is a small branch with no plaque.

LCX is a very small non-dominant artery with no plaque.

RCA is a very large artery that gives rise to PDA and PLA. There is
no plaque.

No ASD or VSD was identified.

There is normal drainage of the pulmonary veins into the left atrium
with two right-sided and two left-sided pulmonary veins.

Left atrial appendage has chicken wing morphology with one lobe and
no thrombus.

Pulmonary artery has normal size.
IMPRESSION: 1. Coronary calcium score of 0. This was 0 percentile for age and
sex matched control.

2. Normal coronary origin.  Right dominance.  No evidence of CAD.

3. No evidence of ASD, VSD or anomalous pulmonary vein return.

OMNISPORT

EXAM:
OVER-READ INTERPRETATION  CT CHEST

The following report is an over-read performed by radiologist Dr.
over-read does not include interpretation of cardiac or coronary
anatomy or pathology. The coronary calcium score/coronary CTA
interpretation by the cardiologist is attached.
FINDINGS: Within the visualized portions of the thorax there are no suspicious
appearing pulmonary nodules or masses, there is no acute
consolidative airspace disease, no pleural effusion, no pneumothorax
and no lymphadenopathy. Visualized portions of the upper abdomen are
unremarkable. There are no aggressive appearing lytic or blastic
lesions noted in the visualized portions of the skeleton.
IMPRESSION: 1. No significant incidental noncardiac findings noted.

## 2015-09-12 MED ORDER — NITROGLYCERIN 0.4 MG SL SUBL
0.4000 mg | SUBLINGUAL_TABLET | SUBLINGUAL | Status: DC | PRN
  Administered 2015-09-12: 0.4 mg via SUBLINGUAL
  Filled 2015-09-12: qty 25

## 2015-09-12 MED ORDER — IOPAMIDOL (ISOVUE-370) INJECTION 76%
INTRAVENOUS | Status: AC
  Administered 2015-09-12: 80 mL
  Filled 2015-09-12: qty 100

## 2015-09-12 MED ORDER — NITROGLYCERIN 0.4 MG SL SUBL
SUBLINGUAL_TABLET | SUBLINGUAL | Status: AC
  Filled 2015-09-12: qty 1

## 2015-09-19 ENCOUNTER — Encounter: Payer: Self-pay | Admitting: Internal Medicine

## 2015-09-19 ENCOUNTER — Ambulatory Visit (INDEPENDENT_AMBULATORY_CARE_PROVIDER_SITE_OTHER): Payer: Federal, State, Local not specified - PPO | Admitting: Internal Medicine

## 2015-09-19 VITALS — BP 126/80 | HR 67 | Ht 70.0 in | Wt 158.2 lb

## 2015-09-19 DIAGNOSIS — R9439 Abnormal result of other cardiovascular function study: Secondary | ICD-10-CM | POA: Diagnosis not present

## 2015-09-19 NOTE — Patient Instructions (Signed)
Your physician recommends that you schedule a follow-up appointment as needed  

## 2015-09-19 NOTE — Progress Notes (Signed)
OFFICE NOTE  Chief Complaint:  Follow-up studies  Primary Care Physician: Sissy Hoff, MD  HPI:  Bradley Reese is a pleasant 35 year old male who is referred to me by [name] for evaluation of shortness of breath and chest discomfort. His mother is actually a patient of mine as well. His family history is significant more on his father's side who had had 2 vessel bypass surgery and a number of other cardiac issues. Mr. Ferrara reports that he has been under a lot of stress recently as his daughter was initially thought to have a VSD but later found to have PAPVR. She underwent surgery at [facility] in his possibly in need of another surgery. Recently he's noted that during weight lifting he has some discomfort in his chest. He gets short of breath and oftentimes causes him to discontinue exercise. He always feels a little bit worse after exercising rather than better. In general though he feels like he is pretty healthy. He plays tennis and is fairly active and does not have any problems doing that type of exercise. He had an unusual episode this past summer where he went to the emergency department and had fever up to 105 with rigors, but there were no clear causes for this episode. He says that the symptoms actually been going on for some time and not necessarily worsening or no crescendo pattern. He denies any typical chest pain. He just feels a strange sensation. Apparently had a cholesterol profile 2014 which showed an LDL 100.  Mr. Maish returns today for follow-up of his studies. He underwent cutting pulmonary exercise testing which showed good exercise tolerance however he had a significant drop in oxygen saturation near and exercise. His O2 saturation dropped to 73% without a clear etiology. There was concern about possible shunting. Subsequently he had an echocardiogram which was already ordered. As I did not know about shunting, the study was not done with bubble contrast.  This demonstrated, unfortunately that he has moderate global hypokinesis with an EF of 45-50% and mild dilation of left ventricle. He has reported chest pain with exertion, but more like shortness of breath and heaviness. He has not done much exercise since his stress testing.  I have a pleasure seeing Mr. Thackston back today in follow-up. As previously mentioned we have been working up what appeared to be a mild cardiomyopathy. He underwent transesophageal echocardiography by myself which demonstrated no evidence of shunting by saline microbubble contrast and Dopplers. There is no evidence of atrial septal defect. Interestingly, LV function looked completely normal, although it was reported to be mildly reduced. Subsequently I went back and reviewed his echocardiogram and personally measured LV EF by Simpson's rule. The EF measured at 57%. I subsequently corrected that report to indicate the true LV function which was normal. I then recommended cardiac CT angiography due to his concern about congenital anomalies and his son who had PAPVR. This was performed on 09/12/2015. It demonstrated normal coronary arteries without anomalies. There was no evidence of coronary calcium. No evidence of ASD, VSD or anomalous pulmonary vein return was noted. No significant extracardiac findings were noted.  PMHx:  No past medical history on file.  Past Surgical History  Procedure Laterality Date  . Septoplasty    . Tee without cardioversion N/A 08/26/2015    Procedure: TRANSESOPHAGEAL ECHOCARDIOGRAM (TEE);  Surgeon: Chrystie Nose, MD;  Location[facility] ENDOSCOPY;  Service: Cardiovascular;  Laterality: N/A;    FAMHx:  Family History  Problem Relation Age of Onset  . Transient ischemic attack Mother   . Hypertension Mother   . COPD Mother   . Cancer Father     stage 4 cancer  . Heart disease Father     double bypass  . Cancer Maternal Grandmother   . Heart disease Paternal Grandmother   . Congenital heart disease  Child     vsd, afo, papvr    SOCHx:   reports that he has never smoked. He has never used smokeless tobacco. He reports that he drinks alcohol. He reports that he does not use illicit drugs.  ALLERGIES:  No Known Allergies  ROS: Pertinent items noted in HPI and remainder of comprehensive ROS otherwise negative.  HOME MEDS: Current Outpatient Prescriptions  Medication Sig Dispense Refill  . Nutritional Supplements (JUICE PLUS FIBRE PO) Take 1 tablet by mouth daily. Reported on 09/12/2015    . Triamcinolone Acetonide (NASACORT AQ NA) Place 1 spray into the nose at bedtime as needed. For allergies     No current facility-administered medications for this visit.    LABS/IMAGING: ADDENDUM REPORT: 09/12/2015 17:27 CLINICAL DATA: 35 year old male with cardiomyopathy of unknown etiology and family history of PAPVR. EXAM: Cardiac/Coronary CT TECHNIQUE: The patient was scanned on a Philips 256 scanner. FINDINGS: A 120 kV prospective scan was triggered in the descending thoracic aorta at 111 HU's. Axial non-contrast 3 mm slices were carried out through the heart. The data set was analyzed on a dedicated work station and scored using the Agatson method. Gantry rotation speed was 270 msecs and collimation was .9 mm. 5 mg of iv Metoprolol and and 0.4 mg of sl NTG was given. The 3D data set was reconstructed in 5% intervals of the 67-82 % of the R-R cycle. Diastolic phases were analyzed on a dedicated work station using MPR, MIP and VRT modes. The patient received 80 cc of contrast. Aorta: Normal size. No calcifications. No dissection. Aortic Valve: Trileaflet with normal thickness and no calcifications. Coronary Arteries: Normal coronary origin. Right dominance. Left main is a large artery and has no plaque. LAD is a large caliber artery with no plaque. D1 is a large artery with no plaque. D2 is a small branch with no plaque. LCX is a very small non-dominant artery with no  plaque. RCA is a very large artery that gives rise to PDA and PLA. There is no plaque. No ASD or VSD was identified. There is normal drainage of the pulmonary veins into the left atrium with two right-sided and two left-sided pulmonary veins. Left atrial appendage has chicken wing morphology with one lobe and no thrombus. Pulmonary artery has normal size. IMPRESSION: 1. Coronary calcium score of 0. This was 0 percentile for age and sex matched control. 2. Normal coronary origin. Right dominance. No evidence of CAD. 3. No evidence of ASD, VSD or anomalous pulmonary vein return. Tobias Alexander Electronically Signed  By: Tobias Alexander  On: 09/12/2015 17:27   WEIGHTS: Wt Readings from Last 3 Encounters:  09/19/15 158 lb 3.2 oz (71.759 kg)  08/26/15 160 lb (72.576 kg)  08/13/15 159 lb (72.122 kg)    VITALS: BP 126/80 mmHg  Pulse 67  Ht  (1.778 m)  Wt 158 lb 3.2 oz (71.759 kg)  BMI 22.70 kg/m2  EXAM: deferred  EKG: deferred  ASSESSMENT: 1. Chest discomfort and shortness of breath with weight lifting - angiographically normal coronaries by cardiac CT angiography with a 0 calcium score 2. No evidence of congenital heart  disease by CT angiography 3. Further review of echo shows normal LVEF both on transthoracic and transesophageal echocardiography. 4. Exercise-induced hypoxemia - no clear evidence of shunting by TEE  PLAN: 1.   Mr. Baird LyonsCasey has undergone a thorough evaluation of his exercise-induced shortness of breath and chest discomfort. I cannot find a cardiac etiology of this. His coronary arteries are normal by CT coronary angiography with a 0 calcium score. There are no anomalous vessels. There is no evidence of anomalous venous return, ASD or VSD. Transthoracic echo also fails to show any intracardiac shunting including by saline microbubble contrast. I reviewed his original transthoracic echocardiogram which was reported to have a mild cardiomyopathy, however  upon personally measuring the left ventricular ejection fraction I obtained a result of 57% which is normal. At this point, there is no evidence of cardiomyopathy as was previously reported. I've struck this information from his medical record. He may have some degree of chronic fatigue but generally is able to do most activities. I think he is very low risk to start exercise and doing other activities. No further cardiac workup is indicated at this time.  Follow-up with me as needed.  Chrystie NoseKenneth C. Dorea Duff, MD, [facility]ney[facility] Attending Cardiologist [facility]  Chrystie NoseKenneth C Wrigley Plasencia 09/19/2015, 9:39 AM

## 2016-01-05 DIAGNOSIS — K080 Exfoliation of teeth due to systemic causes: Secondary | ICD-10-CM | POA: Diagnosis not present

## 2016-01-21 DIAGNOSIS — K080 Exfoliation of teeth due to systemic causes: Secondary | ICD-10-CM | POA: Diagnosis not present

## 2016-01-27 DIAGNOSIS — Z125 Encounter for screening for malignant neoplasm of prostate: Secondary | ICD-10-CM | POA: Diagnosis not present

## 2016-01-27 DIAGNOSIS — R0602 Shortness of breath: Secondary | ICD-10-CM | POA: Diagnosis not present

## 2016-01-27 DIAGNOSIS — R0683 Snoring: Secondary | ICD-10-CM | POA: Diagnosis not present

## 2016-03-15 DIAGNOSIS — R400 Somnolence: Secondary | ICD-10-CM | POA: Diagnosis not present

## 2016-03-31 DIAGNOSIS — Z23 Encounter for immunization: Secondary | ICD-10-CM | POA: Diagnosis not present

## 2016-04-13 DIAGNOSIS — B078 Other viral warts: Secondary | ICD-10-CM | POA: Diagnosis not present

## 2016-05-26 DIAGNOSIS — B078 Other viral warts: Secondary | ICD-10-CM | POA: Diagnosis not present

## 2016-06-03 DIAGNOSIS — G4710 Hypersomnia, unspecified: Secondary | ICD-10-CM | POA: Diagnosis not present

## 2016-06-04 DIAGNOSIS — R400 Somnolence: Secondary | ICD-10-CM | POA: Diagnosis not present

## 2016-06-04 DIAGNOSIS — G4710 Hypersomnia, unspecified: Secondary | ICD-10-CM | POA: Diagnosis not present

## 2016-07-08 DIAGNOSIS — G4712 Idiopathic hypersomnia without long sleep time: Secondary | ICD-10-CM | POA: Diagnosis not present

## 2016-07-12 DIAGNOSIS — K080 Exfoliation of teeth due to systemic causes: Secondary | ICD-10-CM | POA: Diagnosis not present

## 2016-09-30 DIAGNOSIS — Z0000 Encounter for general adult medical examination without abnormal findings: Secondary | ICD-10-CM | POA: Diagnosis not present

## 2016-09-30 DIAGNOSIS — E7800 Pure hypercholesterolemia, unspecified: Secondary | ICD-10-CM | POA: Diagnosis not present

## 2017-01-17 DIAGNOSIS — K080 Exfoliation of teeth due to systemic causes: Secondary | ICD-10-CM | POA: Diagnosis not present

## 2017-04-12 DIAGNOSIS — Z23 Encounter for immunization: Secondary | ICD-10-CM | POA: Diagnosis not present

## 2017-05-16 DIAGNOSIS — S46012A Strain of muscle(s) and tendon(s) of the rotator cuff of left shoulder, initial encounter: Secondary | ICD-10-CM | POA: Diagnosis not present

## 2017-06-22 DIAGNOSIS — M25512 Pain in left shoulder: Secondary | ICD-10-CM | POA: Diagnosis not present

## 2017-06-30 DIAGNOSIS — S46012D Strain of muscle(s) and tendon(s) of the rotator cuff of left shoulder, subsequent encounter: Secondary | ICD-10-CM | POA: Diagnosis not present

## 2017-06-30 DIAGNOSIS — M25512 Pain in left shoulder: Secondary | ICD-10-CM | POA: Diagnosis not present

## 2017-07-12 DIAGNOSIS — Z3009 Encounter for other general counseling and advice on contraception: Secondary | ICD-10-CM | POA: Diagnosis not present

## 2017-08-12 DIAGNOSIS — Z302 Encounter for sterilization: Secondary | ICD-10-CM | POA: Diagnosis not present

## 2017-08-15 DIAGNOSIS — K080 Exfoliation of teeth due to systemic causes: Secondary | ICD-10-CM | POA: Diagnosis not present

## 2017-10-04 DIAGNOSIS — Z0000 Encounter for general adult medical examination without abnormal findings: Secondary | ICD-10-CM | POA: Diagnosis not present

## 2017-10-04 DIAGNOSIS — E7800 Pure hypercholesterolemia, unspecified: Secondary | ICD-10-CM | POA: Diagnosis not present

## 2017-10-04 DIAGNOSIS — Z8042 Family history of malignant neoplasm of prostate: Secondary | ICD-10-CM | POA: Diagnosis not present

## 2018-03-01 DIAGNOSIS — Z23 Encounter for immunization: Secondary | ICD-10-CM | POA: Diagnosis not present

## 2018-11-10 DIAGNOSIS — J310 Chronic rhinitis: Secondary | ICD-10-CM | POA: Diagnosis not present

## 2018-11-10 DIAGNOSIS — H6123 Impacted cerumen, bilateral: Secondary | ICD-10-CM | POA: Diagnosis not present

## 2018-11-10 DIAGNOSIS — Z8709 Personal history of other diseases of the respiratory system: Secondary | ICD-10-CM | POA: Diagnosis not present

## 2018-11-10 DIAGNOSIS — Z8669 Personal history of other diseases of the nervous system and sense organs: Secondary | ICD-10-CM | POA: Diagnosis not present

## 2019-06-29 DIAGNOSIS — Z0000 Encounter for general adult medical examination without abnormal findings: Secondary | ICD-10-CM | POA: Diagnosis not present

## 2019-07-10 DIAGNOSIS — Z8042 Family history of malignant neoplasm of prostate: Secondary | ICD-10-CM | POA: Diagnosis not present

## 2019-07-10 DIAGNOSIS — E7800 Pure hypercholesterolemia, unspecified: Secondary | ICD-10-CM | POA: Diagnosis not present

## 2020-04-17 DIAGNOSIS — Z23 Encounter for immunization: Secondary | ICD-10-CM | POA: Diagnosis not present

## 2020-07-07 DIAGNOSIS — Z0000 Encounter for general adult medical examination without abnormal findings: Secondary | ICD-10-CM | POA: Diagnosis not present

## 2020-07-07 DIAGNOSIS — Z8042 Family history of malignant neoplasm of prostate: Secondary | ICD-10-CM | POA: Diagnosis not present

## 2020-07-07 DIAGNOSIS — E7800 Pure hypercholesterolemia, unspecified: Secondary | ICD-10-CM | POA: Diagnosis not present

## 2021-04-15 DIAGNOSIS — Z23 Encounter for immunization: Secondary | ICD-10-CM | POA: Diagnosis not present

## 2021-08-11 DIAGNOSIS — Z0000 Encounter for general adult medical examination without abnormal findings: Secondary | ICD-10-CM | POA: Diagnosis not present

## 2021-08-11 DIAGNOSIS — E7800 Pure hypercholesterolemia, unspecified: Secondary | ICD-10-CM | POA: Diagnosis not present

## 2021-08-11 DIAGNOSIS — Z8042 Family history of malignant neoplasm of prostate: Secondary | ICD-10-CM | POA: Diagnosis not present

## 2022-07-16 DIAGNOSIS — G8929 Other chronic pain: Secondary | ICD-10-CM | POA: Diagnosis not present

## 2022-08-02 DIAGNOSIS — G8929 Other chronic pain: Secondary | ICD-10-CM | POA: Diagnosis not present

## 2022-08-02 DIAGNOSIS — M545 Low back pain, unspecified: Secondary | ICD-10-CM | POA: Diagnosis not present

## 2022-08-26 DIAGNOSIS — M545 Low back pain, unspecified: Secondary | ICD-10-CM | POA: Diagnosis not present

## 2022-08-31 DIAGNOSIS — D2262 Melanocytic nevi of left upper limb, including shoulder: Secondary | ICD-10-CM | POA: Diagnosis not present

## 2022-08-31 DIAGNOSIS — D2272 Melanocytic nevi of left lower limb, including hip: Secondary | ICD-10-CM | POA: Diagnosis not present

## 2022-08-31 DIAGNOSIS — D225 Melanocytic nevi of trunk: Secondary | ICD-10-CM | POA: Diagnosis not present

## 2022-08-31 DIAGNOSIS — Z1283 Encounter for screening for malignant neoplasm of skin: Secondary | ICD-10-CM | POA: Diagnosis not present

## 2022-08-31 DIAGNOSIS — D485 Neoplasm of uncertain behavior of skin: Secondary | ICD-10-CM | POA: Diagnosis not present

## 2022-09-23 ENCOUNTER — Other Ambulatory Visit: Payer: Self-pay | Admitting: Family Medicine

## 2022-09-23 DIAGNOSIS — Z23 Encounter for immunization: Secondary | ICD-10-CM | POA: Diagnosis not present

## 2022-09-23 DIAGNOSIS — Z0000 Encounter for general adult medical examination without abnormal findings: Secondary | ICD-10-CM | POA: Diagnosis not present

## 2022-09-23 DIAGNOSIS — Z8042 Family history of malignant neoplasm of prostate: Secondary | ICD-10-CM | POA: Diagnosis not present

## 2022-09-23 DIAGNOSIS — E7800 Pure hypercholesterolemia, unspecified: Secondary | ICD-10-CM

## 2022-09-28 ENCOUNTER — Other Ambulatory Visit (HOSPITAL_BASED_OUTPATIENT_CLINIC_OR_DEPARTMENT_OTHER): Payer: Self-pay | Admitting: Family Medicine

## 2022-09-28 ENCOUNTER — Other Ambulatory Visit (HOSPITAL_COMMUNITY): Payer: Self-pay | Admitting: Family Medicine

## 2022-09-28 DIAGNOSIS — L905 Scar conditions and fibrosis of skin: Secondary | ICD-10-CM | POA: Diagnosis not present

## 2022-09-28 DIAGNOSIS — B078 Other viral warts: Secondary | ICD-10-CM | POA: Diagnosis not present

## 2022-09-28 DIAGNOSIS — E7800 Pure hypercholesterolemia, unspecified: Secondary | ICD-10-CM

## 2022-09-28 DIAGNOSIS — D485 Neoplasm of uncertain behavior of skin: Secondary | ICD-10-CM | POA: Diagnosis not present

## 2022-10-21 ENCOUNTER — Ambulatory Visit (HOSPITAL_COMMUNITY)
Admission: RE | Admit: 2022-10-21 | Discharge: 2022-10-21 | Disposition: A | Discharge status: Home / Self Care | Payer: Federal, State, Local not specified - PPO | Source: Ambulatory Visit | Attending: Family Medicine | Admitting: Family Medicine

## 2022-10-21 DIAGNOSIS — E7800 Pure hypercholesterolemia, unspecified: Secondary | ICD-10-CM

## 2023-04-19 DIAGNOSIS — L910 Hypertrophic scar: Secondary | ICD-10-CM | POA: Diagnosis not present

## 2023-04-19 DIAGNOSIS — D2239 Melanocytic nevi of other parts of face: Secondary | ICD-10-CM | POA: Diagnosis not present

## 2023-04-19 DIAGNOSIS — L570 Actinic keratosis: Secondary | ICD-10-CM | POA: Diagnosis not present

## 2023-04-19 DIAGNOSIS — D224 Melanocytic nevi of scalp and neck: Secondary | ICD-10-CM | POA: Diagnosis not present

## 2023-04-19 DIAGNOSIS — D2262 Melanocytic nevi of left upper limb, including shoulder: Secondary | ICD-10-CM | POA: Diagnosis not present

## 2023-04-19 DIAGNOSIS — L738 Other specified follicular disorders: Secondary | ICD-10-CM | POA: Diagnosis not present

## 2023-07-15 DIAGNOSIS — Q263 Partial anomalous pulmonary venous connection: Secondary | ICD-10-CM | POA: Diagnosis not present

## 2023-10-03 DIAGNOSIS — Z8042 Family history of malignant neoplasm of prostate: Secondary | ICD-10-CM | POA: Diagnosis not present

## 2023-10-03 DIAGNOSIS — R7309 Other abnormal glucose: Secondary | ICD-10-CM | POA: Diagnosis not present

## 2023-10-03 DIAGNOSIS — Z0000 Encounter for general adult medical examination without abnormal findings: Secondary | ICD-10-CM | POA: Diagnosis not present

## 2023-10-03 DIAGNOSIS — E7800 Pure hypercholesterolemia, unspecified: Secondary | ICD-10-CM | POA: Diagnosis not present

## 2023-10-04 DIAGNOSIS — K219 Gastro-esophageal reflux disease without esophagitis: Secondary | ICD-10-CM | POA: Diagnosis not present

## 2023-10-17 DIAGNOSIS — Z79899 Other long term (current) drug therapy: Secondary | ICD-10-CM | POA: Diagnosis not present

## 2023-10-17 DIAGNOSIS — M3300 Juvenile dermatopolymyositis, organ involvement unspecified: Secondary | ICD-10-CM | POA: Diagnosis not present

## 2023-10-21 DIAGNOSIS — D2261 Melanocytic nevi of right upper limb, including shoulder: Secondary | ICD-10-CM | POA: Diagnosis not present

## 2023-10-21 DIAGNOSIS — L821 Other seborrheic keratosis: Secondary | ICD-10-CM | POA: Diagnosis not present

## 2023-10-21 DIAGNOSIS — L905 Scar conditions and fibrosis of skin: Secondary | ICD-10-CM | POA: Diagnosis not present

## 2023-10-21 DIAGNOSIS — D2262 Melanocytic nevi of left upper limb, including shoulder: Secondary | ICD-10-CM | POA: Diagnosis not present

## 2023-10-21 DIAGNOSIS — D225 Melanocytic nevi of trunk: Secondary | ICD-10-CM | POA: Diagnosis not present

## 2023-10-21 DIAGNOSIS — B078 Other viral warts: Secondary | ICD-10-CM | POA: Diagnosis not present

## 2024-01-16 DIAGNOSIS — Z79899 Other long term (current) drug therapy: Secondary | ICD-10-CM | POA: Diagnosis not present

## 2024-01-16 DIAGNOSIS — M6281 Muscle weakness (generalized): Secondary | ICD-10-CM | POA: Diagnosis not present

## 2024-01-16 DIAGNOSIS — M3300 Juvenile dermatopolymyositis, organ involvement unspecified: Secondary | ICD-10-CM | POA: Diagnosis not present
